# Patient Record
Sex: Male | Born: 1960
Health system: Southern US, Community
[De-identification: ages and names within clinical notes are randomized; demographics above are authoritative.]

## PROBLEM LIST (undated history)

## (undated) DIAGNOSIS — M1990 Unspecified osteoarthritis, unspecified site: Secondary | ICD-10-CM

## (undated) HISTORY — PX: VASECTOMY: SHX75

## (undated) HISTORY — DX: Unspecified osteoarthritis, unspecified site: M19.90

## (undated) HISTORY — PX: REPLACEMENT TOTAL KNEE BILATERAL: SUR1225

## (undated) HISTORY — PX: FRACTURE SURGERY: SHX138

---

## 2003-04-27 LAB — HM HIV SCREENING LAB: HM HIV Screening: NEGATIVE

## 2004-02-14 ENCOUNTER — Ambulatory Visit: Payer: Self-pay | Admitting: Surgery

## 2005-05-04 ENCOUNTER — Other Ambulatory Visit: Payer: Self-pay

## 2005-05-04 ENCOUNTER — Inpatient Hospital Stay: Payer: Self-pay | Admitting: General Practice

## 2006-05-01 ENCOUNTER — Ambulatory Visit: Payer: Self-pay | Admitting: Gastroenterology

## 2007-11-05 ENCOUNTER — Ambulatory Visit: Payer: Self-pay | Admitting: Unknown Physician Specialty

## 2007-11-11 ENCOUNTER — Ambulatory Visit: Payer: Self-pay | Admitting: Unknown Physician Specialty

## 2008-10-22 ENCOUNTER — Emergency Department: Payer: Self-pay | Admitting: Emergency Medicine

## 2008-12-13 ENCOUNTER — Ambulatory Visit: Payer: Self-pay | Admitting: Urology

## 2011-08-29 ENCOUNTER — Ambulatory Visit: Payer: Self-pay | Admitting: General Practice

## 2011-08-29 LAB — MRSA PCR SCREENING

## 2011-08-29 LAB — BASIC METABOLIC PANEL
BUN: 18 mg/dL (ref 7–18)
Calcium, Total: 9.1 mg/dL (ref 8.5–10.1)
Chloride: 104 mmol/L (ref 98–107)
Co2: 32 mmol/L (ref 21–32)
EGFR (African American): >60
Glucose: 103 mg/dL — ABNORMAL HIGH (ref 65–99)
Osmolality: 283 (ref 275–301)
Sodium: 141 mmol/L (ref 136–145)

## 2011-08-29 LAB — URINALYSIS, COMPLETE
Leukocyte Esterase: NEGATIVE
Nitrite: NEGATIVE
Ph: 5 (ref 4.5–8.0)
Protein: NEGATIVE
Squamous Epithelial: <1
WBC UR: NONE SEEN /HPF (ref 0–5)

## 2011-08-29 LAB — CBC
MCHC: 34.3 g/dL (ref 32.0–36.0)
RBC: 5.09 10*6/uL (ref 4.40–5.90)
RDW: 13.3 % (ref 11.5–14.5)
WBC: 5 10*3/uL (ref 3.8–10.6)

## 2011-08-29 LAB — PROTIME-INR: INR: 0.9

## 2011-09-12 ENCOUNTER — Inpatient Hospital Stay: Payer: Self-pay | Admitting: General Practice

## 2011-09-13 LAB — BASIC METABOLIC PANEL
BUN: 10 mg/dL (ref 7–18)
Calcium, Total: 8.1 mg/dL — ABNORMAL LOW (ref 8.5–10.1)
Creatinine: 0.99 mg/dL (ref 0.60–1.30)
EGFR (Non-African Amer.): >60
Glucose: 111 mg/dL — ABNORMAL HIGH (ref 65–99)
Osmolality: 277 (ref 275–301)
Potassium: 4 mmol/L (ref 3.5–5.1)
Sodium: 139 mmol/L (ref 136–145)

## 2011-09-14 LAB — HEMOGLOBIN: HGB: 11.9 g/dL — ABNORMAL LOW (ref 13.0–18.0)

## 2011-09-14 LAB — BASIC METABOLIC PANEL
Anion Gap: 7 (ref 7–16)
BUN: 10 mg/dL (ref 7–18)
Chloride: 97 mmol/L — ABNORMAL LOW (ref 98–107)
Co2: 32 mmol/L (ref 21–32)
Creatinine: 1.02 mg/dL (ref 0.60–1.30)
EGFR (African American): >60
EGFR (Non-African Amer.): >60
Osmolality: 271 (ref 275–301)
Sodium: 136 mmol/L (ref 136–145)

## 2012-06-18 ENCOUNTER — Ambulatory Visit: Payer: Self-pay | Admitting: Family Medicine

## 2012-06-26 ENCOUNTER — Ambulatory Visit: Payer: Self-pay | Admitting: Urology

## 2014-08-15 NOTE — Discharge Summary (Signed)
PATIENT NAME:  Douglas Warner, Douglas Warner MR#:  [id number] DATE OF BIRTH:  03-26-1961  DATE OF ADMISSION:  09/12/2011 DATE OF DISCHARGE:  09/14/2011  ADMITTING DIAGNOSIS: Degenerative arthrosis of right knee.   DISCHARGE DIAGNOSIS: Degenerative arthrosis of right knee.   HISTORY OF PRESENT ILLNESS: Patient is a 54 year old who has been followed at [facility] for progression of right knee pain. 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 He has continued to have significant medial joint line pain especially with weight-bearing activities. He denied any gross swelling of the knee. He did not see any significant improvement in condition despite activity modification, nonsteroidal medications as well as intraarticular cortisone injections. The pain had progressed to the point that it was significantly interfering with his activities of daily living. X-rays taken in [facility] orthopedics showed significant narrowing of the medial cartilage space with associated varus alignment. Subchondral sclerosis was noted. After discussion of the risks and benefits of surgical intervention, the patient expressed his understanding of the risks and benefits of surgery and agreed for plans for surgical intervention.   PROCEDURE: Right total knee arthroplasty using computer-assisted navigation.   ANESTHESIA: Femoral nerve block with spinal.   SOFT TISSUE RELEASE: Anterior cruciate ligament, posterior cruciate ligament, deep medial collateral ligaments as well as the patellofemoral ligament.   IMPLANTS UTILIZED: DePuy PFC Sigma size 5 posterior stabilized femoral component (cemented), size 5 MBT tibial component (cemented), 38 mm three pegged oval dome patella (cemented), and a 10 mm stabilized rotating platform polyethylene insert.   HOSPITAL COURSE: Patient tolerated the procedure  very well. He had no complications. He was then taken to PAC-U where he was stabilized then transferred to the orthopedic floor. He began receiving anticoagulation therapy of Lovenox 30 mg sub-Q q.12 hours per anesthesia and pharmacy protocol. He was fitted with TED stockings bilaterally. These were allowed to be removed one hour per eight hour shift. The right one was applied on day two following removal of the Hemovac and dressing change. Patient was also fitted with the AVI compression foot pumps set at 80 mmHg. Her calves have been nontender and free of any evidence of any deep venous thromboses. Negative Homans sign. Patient's heels were elevated off the bed using rolled towels. He has voiced no complaints.   Patient's vital signs have been stable. He has been afebrile. Hemodynamically he was stable. No transfusions were given other than the Autovac transfusions first six hours postoperatively.   Physical therapy was initiated on day one for gait training and transfers. Upon being discharged was ambulating greater than 200 feet. Was able go up and down four sets of steps. Was independent with bed to chair transfers. Occupational therapy was also initiated on day one for activities of daily living and assistive devices.   Patient's IV and Foley were discontinued on day one. Hemovac was discontinued on day two along with dressing change. The wound was free of any drainage or signs of infection. Polar Care was reapplied to the surgical leg maintaining a temperature of 40 to 50 degrees Fahrenheit.   DISPOSITION: Patient is being discharged to home in improved stable condition.   DISCHARGE INSTRUCTIONS:  1. May weight bear as tolerated. Continue using a walker until cleared by physical therapy to go to a quad cane.  2. Will receive home health physical therapy.  3. He is  to wear TED stockings during the day but may remove these at night.  4. He is to continue using Polar Care around-the-clock as much  as possible. He is to maintain a temperature of 40 to 50 degrees Fahrenheit.  5. He is placed on a regular diet.  6. He is to return to clinic in two weeks for removal of staples, sooner if any temperatures of 101.5 or greater or excessive bleeding.  7. He is to resume his regular medications that he was on prior to admission. He was given a prescription for Lovenox 40 mg sub-Q daily for 14 days, then discontinue and begin taking one 81 mg enteric coated aspirin. Prescription for Roxicodone 5 mg 1 to 2 tablets every 4 to 6 hours p.r.n. for pain and Ultram 50 mg 1 to 2 tablets every 4 to 6 hours p.r.n. for pain.       PAST MEDICAL HISTORY:  1. Hemorrhoids. 2. Chickenpox.   ____________________________ Van ClinesJon Wolfe, PA jrw:cms D: 09/14/2011 06:48:30 ET T: 09/17/2011 10:19:20 ET JOB#: [id number]  cc: Van ClinesJon Wolfe, PA, <Dictator> JON WOLFE PA ELECTRONICALLY SIGNED 09/25/2011 8:18

## 2014-08-15 NOTE — Op Note (Signed)
PATIENT NAME:  Douglas Warner, [name] MR#:  [id number] DATE OF BIRTH:  09-19-1960  DATE OF PROCEDURE:  09/12/2011  PREOPERATIVE DIAGNOSIS: Degenerative arthrosis of the right knee.   POSTOPERATIVE DIAGNOSIS: Degenerative arthrosis of the right knee.   PROCEDURE PERFORMED: Right total knee arthroplasty using computer-assisted navigation.   SURGEON: Illene LabradorJames P. Hooten, M.D.   ASSISTANT: Van ClinesJon Wolfe, PA-C (required to maintain retraction throughout the procedure)   ANESTHESIA: Femoral nerve block and spinal.   ESTIMATED BLOOD LOSS: 200 mL.   FLUIDS REPLACED: 1500 mL of crystalloid.   TOURNIQUET TIME: 109 minutes.   DRAINS: Two medium drains to reinfusion system.   SOFT TISSUE RELEASES: Anterior cruciate ligament, posterior cruciate ligament, deep medial collateral ligament, and patellofemoral ligament.   IMPLANTS UTILIZED: DePuy PFC Sigma size 5 posterior stabilized femoral component (cemented), size 5 MBT tibial component (cemented), 38-mm three peg oval dome patella (cemented), and a 10-mm stabilized rotating platform polyethylene insert.   INDICATIONS FOR SURGERY: The patient is a 54 year old male who has been seen for complaints of progressive right knee pain. X-rays demonstrated significant degenerative changes with varus deformity. After discussion of the risks and benefits of surgical intervention, the patient expressed his understanding of the risks and benefits and agreed with plans for surgical intervention.   PROCEDURE IN DETAIL: The patient was brought to the operating Room and, after adequate femoral nerve block and spinal anesthesia was achieved, a tourniquet was placed on the patient's upper right thigh. The patient's right knee and leg were cleaned and prepped with alcohol and DuraPrep and draped in the usual sterile fashion. A "time-out" was performed as per usual protocol. The right lower extremity was exsanguinated using an Esmarch and the tourniquet was inflated to 300 mmHg.  Anterior longitudinal incision was made followed by a standard mid vastus approach. A large effusion was evacuated. The deep fibers of the medial collateral ligament were elevated in a subperiosteal fashion off the medial flare of the tibia so as to maintain a continuous soft tissue sleeve. The patella was subluxed laterally and the patellofemoral ligament was incised. Inspection of the knee demonstrated severe degenerative changes in a tricompartmental fashion with most significant changes noted to the medial compartment. There was evidence of eburnated bone noted to the medial compartment. Prominent osteophytes were debrided using a rongeur. Anterior and posterior cruciate ligaments were excised. Two 4-mm Schanz pins were inserted into the femur and into the tibia for attachment of the tracker device as used for computer-assisted navigation. Hip center was identified using circumduction technique. Distal landmarks were mapped using the computer. Distal femur and proximal tibia were mapped using the computer. Distal femoral cutting guide was positioned using computer-assisted navigation so as to achieve 5-degree distal valgus cut. Cut was performed and verified using the computer.  The distal femur was then sized and size 5 femoral component was appropriate. A size 5 cutting guide was positioned and the anterior cut was performed. This was followed by completion of the posterior and chamfer cuts. The femoral cutting guide for the central box was then positioned and the central box cut was performed.   Attention was then directed to the proximal tibia. Medial and lateral menisci were excised. The extramedullary tibial cutting guide was positioned using computer-assisted navigation so as to achieve 0-degrees varus valgus alignment and 0-degree posterior slope. The cut was performed and verified using the computer. The proximal tibia was sized and it was felt that a size 5 tibial tray was appropriate. Tibial and  femoral  trials were inserted followed by insertion of a 10-mm polyethylene insert. Good medial and lateral soft tissue balancing was appreciated in both flexion and extension. Finally, the patella was cut and prepared so as to accommodate a 38-mm three peg oval dome patella. The patellar trial was placed and the knee was placed through a range of motion with excellent patellar tracking appreciated. The femoral trial was removed after debridement of posterior osteophytes. The post hole for the tibial component was reamed followed by insertion of a keel punch. The tibial trial was then removed. The cut surfaces of bone were irrigated with copious amounts of normal saline with antibiotic solution using pulsatile lavage and then suctioned dry. Polymethyl methacrylate cement with gentamicin was mixed in the usual fashion using a vacuum mixer. Cement was applied to the cut surface of the proximal tibia as well as along the undersurface of a size 5 MBT tibial component. The tibial component was positioned and impacted into place. Excess cement was removed using Personal assistant. Cement was then applied to the cut surface of the femur as well as along the posterior flanges of a size 5 posterior stabilized femoral component. The femoral component was positioned and impacted into place. Excess cement was removed using Personal assistant. A 10-mm polyethylene trial was inserted and the knee was brought into full extension with steady axial compression applied. Finally, cement was applied to the backside of a 38-mm three peg oval dome patella and the patellar component was positioned and the patellar clamp applied. Excess cement was removed using Personal assistant.   After adequate curing of the cement, the tourniquet was deflated after a total tourniquet time of 109 minutes. Hemostasis was achieved using electrocautery. The knee was irrigated with copious amounts of normal saline with antibiotic solution using pulsatile lavage and  then suctioned dry. The knee was inspected for any residual cement debris. 30 mL of 0.25% Marcaine with epinephrine was injected along the posterior capsule. A 10-mm stabilized rotating platform polyethylene insert was inserted and the knee was placed through a range of motion. Excellent patellar tracking was appreciated. Excellent medial and lateral soft tissue balancing was appreciated both in full extension and in flexion. Two medium drains were placed in the wound bed and brought out through a separate stab incision to be attached to a reinfusion system. The medial parapatellar portion of the incision was reapproximated using interrupted sutures of #1 Vicryl. The subcutaneous tissue was approximated in layers using first #0 Vicryl followed by #2-0 Vicryl. Skin was closed with skin staples. A sterile dressing was applied.   The patient tolerated procedure well. He was transported to the recovery room in stable condition.     ____________________________ Illene Labrador. Angie Fava., MD jph:bjt D: 09/13/2011 16:10:96 ET T: 09/13/2011 12:50:04 ET JOB#: [id number]  cc: Fayrene Fearing P. Angie Fava., MD, <Dictator> JAMES P Angie Fava MD ELECTRONICALLY SIGNED 09/14/2011 6:26

## 2015-05-30 ENCOUNTER — Emergency Department (HOSPITAL_COMMUNITY): Payer: 59

## 2015-05-30 ENCOUNTER — Emergency Department (HOSPITAL_COMMUNITY)
Admission: EM | Admit: 2015-05-30 | Discharge: 2015-05-30 | Disposition: A | Discharge status: Home / Self Care | Payer: 59 | Attending: Emergency Medicine | Admitting: Emergency Medicine

## 2015-05-30 ENCOUNTER — Encounter (HOSPITAL_COMMUNITY): Payer: Self-pay | Admitting: *Deleted

## 2015-05-30 DIAGNOSIS — Z87891 Personal history of nicotine dependence: Secondary | ICD-10-CM | POA: Diagnosis not present

## 2015-05-30 DIAGNOSIS — S82831A Other fracture of upper and lower end of right fibula, initial encounter for closed fracture: Secondary | ICD-10-CM | POA: Diagnosis not present

## 2015-05-30 DIAGNOSIS — Y9389 Activity, other specified: Secondary | ICD-10-CM | POA: Diagnosis not present

## 2015-05-30 DIAGNOSIS — Y92410 Unspecified street and highway as the place of occurrence of the external cause: Secondary | ICD-10-CM | POA: Diagnosis not present

## 2015-05-30 DIAGNOSIS — S9304XA Dislocation of right ankle joint, initial encounter: Secondary | ICD-10-CM | POA: Diagnosis not present

## 2015-05-30 DIAGNOSIS — Y998 Other external cause status: Secondary | ICD-10-CM | POA: Insufficient documentation

## 2015-05-30 DIAGNOSIS — Z880 Allergy status to penicillin: Secondary | ICD-10-CM | POA: Diagnosis not present

## 2015-05-30 DIAGNOSIS — S99921A Unspecified injury of right foot, initial encounter: Secondary | ICD-10-CM

## 2015-05-30 DIAGNOSIS — S82451A Displaced comminuted fracture of shaft of right fibula, initial encounter for closed fracture: Secondary | ICD-10-CM | POA: Insufficient documentation

## 2015-05-30 DIAGNOSIS — S92334A Nondisplaced fracture of third metatarsal bone, right foot, initial encounter for closed fracture: Secondary | ICD-10-CM | POA: Diagnosis not present

## 2015-05-30 DIAGNOSIS — S299XXA Unspecified injury of thorax, initial encounter: Secondary | ICD-10-CM | POA: Diagnosis not present

## 2015-05-30 DIAGNOSIS — S93304A Unspecified dislocation of right foot, initial encounter: Secondary | ICD-10-CM | POA: Diagnosis not present

## 2015-05-30 DIAGNOSIS — Z23 Encounter for immunization: Secondary | ICD-10-CM | POA: Diagnosis not present

## 2015-05-30 DIAGNOSIS — S82891A Other fracture of right lower leg, initial encounter for closed fracture: Secondary | ICD-10-CM | POA: Diagnosis not present

## 2015-05-30 DIAGNOSIS — S8992XA Unspecified injury of left lower leg, initial encounter: Secondary | ICD-10-CM | POA: Diagnosis not present

## 2015-05-30 DIAGNOSIS — Z96651 Presence of right artificial knee joint: Secondary | ICD-10-CM | POA: Diagnosis not present

## 2015-05-30 DIAGNOSIS — S3993XA Unspecified injury of pelvis, initial encounter: Secondary | ICD-10-CM | POA: Diagnosis not present

## 2015-05-30 DIAGNOSIS — M79651 Pain in right thigh: Secondary | ICD-10-CM | POA: Diagnosis not present

## 2015-05-30 DIAGNOSIS — Z471 Aftercare following joint replacement surgery: Secondary | ICD-10-CM | POA: Diagnosis not present

## 2015-05-30 DIAGNOSIS — M24874 Other specific joint derangements of right foot, not elsewhere classified: Secondary | ICD-10-CM | POA: Diagnosis not present

## 2015-05-30 LAB — CBC WITH DIFFERENTIAL/PLATELET
Basophils Absolute: 0 10*3/uL (ref 0.0–0.1)
Basophils Relative: 0 %
Eosinophils Absolute: 0.1 10*3/uL (ref 0.0–0.7)
Eosinophils Relative: 1 %
HCT: 45 % (ref 39.0–52.0)
HEMOGLOBIN: 15.6 g/dL (ref 13.0–17.0)
LYMPHS ABS: 1.9 10*3/uL (ref 0.7–4.0)
LYMPHS PCT: 25 %
MCH: 30.8 pg (ref 26.0–34.0)
MCHC: 34.7 g/dL (ref 30.0–36.0)
MCV: 88.8 fL (ref 78.0–100.0)
Monocytes Absolute: 0.5 10*3/uL (ref 0.1–1.0)
Monocytes Relative: 6 %
NEUTROS ABS: 5 10*3/uL (ref 1.7–7.7)
NEUTROS PCT: 68 %
Platelets: 206 10*3/uL (ref 150–400)
RBC: 5.07 MIL/uL (ref 4.22–5.81)
RDW: 12.7 % (ref 11.5–15.5)
WBC: 7.4 10*3/uL (ref 4.0–10.5)

## 2015-05-30 LAB — COMPREHENSIVE METABOLIC PANEL
ALK PHOS: 79 U/L (ref 38–126)
ALT: 26 U/L (ref 17–63)
AST: 34 U/L (ref 15–41)
Albumin: 5 g/dL (ref 3.5–5.0)
Anion gap: 14 (ref 5–15)
BUN: 17 mg/dL (ref 6–20)
CALCIUM: 8.9 mg/dL (ref 8.9–10.3)
CO2: 27 mmol/L (ref 22–32)
CREATININE: 1.88 mg/dL — AB (ref 0.61–1.24)
Chloride: 96 mmol/L — ABNORMAL LOW (ref 101–111)
GFR, EST AFRICAN AMERICAN: 45 mL/min — AB (ref >60)
GFR, EST NON AFRICAN AMERICAN: 39 mL/min — AB (ref >60)
Glucose, Bld: 105 mg/dL — ABNORMAL HIGH (ref 65–99)
Potassium: 3.8 mmol/L (ref 3.5–5.1)
SODIUM: 137 mmol/L (ref 135–145)
Total Bilirubin: 1.5 mg/dL — ABNORMAL HIGH (ref 0.3–1.2)
Total Protein: 6.8 g/dL (ref 6.5–8.1)

## 2015-05-30 LAB — PROTIME-INR
INR: 1 (ref 0.00–1.49)
Prothrombin Time: 13.4 seconds (ref 11.6–15.2)

## 2015-05-30 LAB — TYPE AND SCREEN
ABO/RH(D): A POS
ANTIBODY SCREEN: NEGATIVE

## 2015-05-30 LAB — CDS SEROLOGY

## 2015-05-30 MED ORDER — DIAZEPAM 5 MG/ML IJ SOLN
5.0000 mg | Freq: Once | INTRAMUSCULAR | Status: AC
  Administered 2015-05-30: 5 mg via INTRAVENOUS
  Filled 2015-05-30: qty 2

## 2015-05-30 MED ORDER — FENTANYL CITRATE (PF) 100 MCG/2ML IJ SOLN
100.0000 ug | Freq: Once | INTRAMUSCULAR | Status: AC
  Administered 2015-05-30: 50 ug via INTRAVENOUS
  Filled 2015-05-30: qty 2

## 2015-05-30 MED ORDER — HYDROMORPHONE HCL 1 MG/ML IJ SOLN
1.0000 mg | Freq: Once | INTRAMUSCULAR | Status: DC
  Filled 2015-05-30: qty 1

## 2015-05-30 MED ORDER — SODIUM CHLORIDE 0.9 % IV BOLUS (SEPSIS)
1000.0000 mL | Freq: Once | INTRAVENOUS | Status: AC
  Administered 2015-05-30: 1000 mL via INTRAVENOUS

## 2015-05-30 MED ORDER — CLINDAMYCIN PHOSPHATE 600 MG/50ML IV SOLN
600.0000 mg | Freq: Once | INTRAVENOUS | Status: AC
  Administered 2015-05-30: 600 mg via INTRAVENOUS
  Filled 2015-05-30: qty 50

## 2015-05-30 MED ORDER — OXYCODONE-ACETAMINOPHEN 5-325 MG PO TABS: 1.0000 | ORAL_TABLET | Freq: Three times a day (TID) | ORAL | Status: DC | PRN

## 2015-05-30 MED ORDER — LIDOCAINE HCL 2 % IJ SOLN
10.0000 mL | Freq: Once | INTRAMUSCULAR | Status: AC
  Administered 2015-05-30: 200 mg via INTRADERMAL
  Filled 2015-05-30: qty 20

## 2015-05-30 MED ORDER — HYDROMORPHONE HCL 1 MG/ML IJ SOLN
1.0000 mg | Freq: Once | INTRAMUSCULAR | Status: AC
  Administered 2015-05-30: 1 mg via INTRAVENOUS
  Filled 2015-05-30: qty 1

## 2015-05-30 MED ORDER — TETANUS-DIPHTH-ACELL PERTUSSIS 5-2.5-18.5 LF-MCG/0.5 IM SUSP
0.5000 mL | Freq: Once | INTRAMUSCULAR | Status: AC
Start: 2015-05-30 — End: 2015-05-30
  Administered 2015-05-30: 0.5 mL via INTRAMUSCULAR
  Filled 2015-05-30: qty 0.5

## 2015-05-30 NOTE — Progress Notes (Signed)
Orthopedic Tech Progress Note Patient Details:  Douglas Warner [birth year] [id number]  Ortho Devices Type of Ortho Device: Warner (short leg) splint, Stirrup splint Ortho Device/Splint Location: rle Ortho Device/Splint Interventions: Ordered, Application Assisted dr Ranell Patrick with ankle dislocation reduction  Douglas Warner 05/30/2015, 9:36 PM

## 2015-05-30 NOTE — Discharge Instructions (Signed)

## 2015-05-30 NOTE — ED Notes (Signed)
C collar removed by Silverio Lay, MD
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
## 2015-05-30 NOTE — Progress Notes (Signed)
Orthopedic Tech Progress Note Patient Details:  Douglas Warner Jan 03, 1961 [id number]  Ortho Devices Type of Ortho Device: Post (short leg) splint, Stirrup splint Ortho Device/Splint Location: rle Ortho Device/Splint Interventions: Ordered, Application Assisted dr Ranell Patrick to replace fiberglass splint with plaster splint  [name] 05/30/2015, 10:27 PM
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
## 2015-05-31 LAB — ABO/RH: ABO/RH(D): A POS

## 2015-06-06 DIAGNOSIS — S92341A Displaced fracture of fourth metatarsal bone, right foot, initial encounter for closed fracture: Secondary | ICD-10-CM | POA: Diagnosis not present

## 2015-06-06 DIAGNOSIS — S92331A Displaced fracture of third metatarsal bone, right foot, initial encounter for closed fracture: Secondary | ICD-10-CM | POA: Diagnosis not present

## 2015-06-06 DIAGNOSIS — S9304XA Dislocation of right ankle joint, initial encounter: Secondary | ICD-10-CM | POA: Diagnosis not present

## 2015-06-06 DIAGNOSIS — S92211A Displaced fracture of cuboid bone of right foot, initial encounter for closed fracture: Secondary | ICD-10-CM | POA: Diagnosis not present

## 2015-07-06 DIAGNOSIS — S92211D Displaced fracture of cuboid bone of right foot, subsequent encounter for fracture with routine healing: Secondary | ICD-10-CM | POA: Diagnosis not present

## 2015-07-06 DIAGNOSIS — S92341A Displaced fracture of fourth metatarsal bone, right foot, initial encounter for closed fracture: Secondary | ICD-10-CM | POA: Diagnosis not present

## 2015-07-06 DIAGNOSIS — S92331A Displaced fracture of third metatarsal bone, right foot, initial encounter for closed fracture: Secondary | ICD-10-CM | POA: Diagnosis not present

## 2015-08-10 DIAGNOSIS — S92311D Displaced fracture of first metatarsal bone, right foot, subsequent encounter for fracture with routine healing: Secondary | ICD-10-CM | POA: Diagnosis not present

## 2015-08-10 DIAGNOSIS — S92211D Displaced fracture of cuboid bone of right foot, subsequent encounter for fracture with routine healing: Secondary | ICD-10-CM | POA: Diagnosis not present
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
## 2017-05-01 ENCOUNTER — Ambulatory Visit (INDEPENDENT_AMBULATORY_CARE_PROVIDER_SITE_OTHER): Payer: 59 | Admitting: Family Medicine

## 2017-05-01 ENCOUNTER — Other Ambulatory Visit: Payer: Self-pay

## 2017-05-01 ENCOUNTER — Encounter: Payer: Self-pay | Admitting: Family Medicine

## 2017-05-01 VITALS — BP 132/72 | HR 85 | Temp 98.2°F | Resp 16 | Ht 72.0 in | Wt 205.0 lb

## 2017-05-01 DIAGNOSIS — Z0000 Encounter for general adult medical examination without abnormal findings: Secondary | ICD-10-CM | POA: Diagnosis not present

## 2017-05-01 DIAGNOSIS — Z800 Family history of malignant neoplasm of digestive organs: Secondary | ICD-10-CM | POA: Diagnosis not present

## 2017-05-01 DIAGNOSIS — Z1159 Encounter for screening for other viral diseases: Secondary | ICD-10-CM

## 2017-05-01 DIAGNOSIS — F17220 Nicotine dependence, chewing tobacco, uncomplicated: Secondary | ICD-10-CM | POA: Insufficient documentation

## 2017-05-01 NOTE — Assessment & Plan Note (Signed)
Discussed risks of tobacco use Patient is not interested in quitting Continue to address at future visits
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
## 2017-05-01 NOTE — Patient Instructions (Signed)

## 2017-05-01 NOTE — Assessment & Plan Note (Signed)
Will request colonoscopy records from GI Due to mother's colon cancer, patient may be candidate for q5 yr screening He declines referral at this time

## 2017-05-02 LAB — COMPREHENSIVE METABOLIC PANEL
ALT: 20 IU/L (ref 0–44)
AST: 19 IU/L (ref 0–40)
Albumin/Globulin Ratio: 2 (ref 1.2–2.2)
Albumin: 4.7 g/dL (ref 3.5–5.5)
Alkaline Phosphatase: 66 IU/L (ref 39–117)
BILIRUBIN TOTAL: 1.6 mg/dL — AB (ref 0.0–1.2)
BUN/Creatinine Ratio: 9 (ref 9–20)
BUN: 9 mg/dL (ref 6–24)
CALCIUM: 9.6 mg/dL (ref 8.7–10.2)
CHLORIDE: 104 mmol/L (ref 96–106)
CO2: 25 mmol/L (ref 20–29)
Creatinine, Ser: 0.97 mg/dL (ref 0.76–1.27)
GFR calc non Af Amer: 86 mL/min/{1.73_m2} (ref >59)
GFR, EST AFRICAN AMERICAN: 100 mL/min/{1.73_m2} (ref >59)
Globulin, Total: 2.3 g/dL (ref 1.5–4.5)
Glucose: 98 mg/dL (ref 65–99)
Potassium: 4.3 mmol/L (ref 3.5–5.2)
Sodium: 141 mmol/L (ref 134–144)
TOTAL PROTEIN: 7 g/dL (ref 6.0–8.5)

## 2017-05-02 LAB — LIPID PANEL
Chol/HDL Ratio: 3.7 ratio (ref 0.0–5.0)
Cholesterol, Total: 181 mg/dL (ref 100–199)
HDL: 49 mg/dL (ref >39)
LDL Calculated: 114 mg/dL — ABNORMAL HIGH (ref 0–99)
TRIGLYCERIDES: 89 mg/dL (ref 0–149)
VLDL CHOLESTEROL CAL: 18 mg/dL (ref 5–40)

## 2017-05-02 LAB — CBC
Hematocrit: 43.6 % (ref 37.5–51.0)
Hemoglobin: 15 g/dL (ref 13.0–17.7)
MCH: 30.4 pg (ref 26.6–33.0)
MCHC: 34.4 g/dL (ref 31.5–35.7)
MCV: 88 fL (ref 79–97)
PLATELETS: 198 10*3/uL (ref 150–379)
RBC: 4.93 x10E6/uL (ref 4.14–5.80)
RDW: 13.4 % (ref 12.3–15.4)
WBC: 6 10*3/uL (ref 3.4–10.8)

## 2017-05-02 LAB — HEPATITIS C ANTIBODY

## 2017-05-03 ENCOUNTER — Telehealth: Payer: Self-pay

## 2017-05-03 NOTE — Telephone Encounter (Signed)
Patient advised as below.  

## 2017-05-03 NOTE — Telephone Encounter (Signed)
-----   Message from Erasmo DownerAngela M Bacigalupo, MD sent at 05/03/2017  4:20 PM EST ----- Normal Blood counts, kidney function, liver function, electrolytes. Cholesterol is ok.  Negative Hep C screen  Erasmo DownerBacigalupo, Angela M, MD, MPH [facility] 05/03/2017 4:20 PM

## 2018-05-02 ENCOUNTER — Encounter: Payer: 59 | Admitting: Family Medicine

## 2018-05-02 NOTE — Progress Notes (Deleted)
Patient: Douglas Warner, Male    DOB: 1961/03/09, 58 y.o.   MRN: [id number] Visit Date: 05/02/2018  Today's Provider: Shirlee Latch, MD   No chief complaint on file.  Subjective:    Annual physical exam Douglas Warner is a 58 y.o. male who presents today for health maintenance and complete physical. He feels {DESC; WELL/FAIRLY WELL/POORLY:18703}. He reports exercising ***. He reports he is sleeping {DESC; WELL/FAIRLY WELL/POORLY:18703}.  -----------------------------------------------------------------   Review of Systems  Constitutional: Negative.   HENT: Negative.   Eyes: Negative.   Respiratory: Negative.   Cardiovascular: Negative.   Gastrointestinal: Negative.   Endocrine: Negative.   Genitourinary: Negative.   Musculoskeletal: Negative.   Skin: Negative.   Allergic/Immunologic: Negative.   Neurological: Negative.   Hematological: Negative.   Psychiatric/Behavioral: Negative.     Social History      He  reports that he has been smoking cigars. His smokeless tobacco use includes chew. He reports current alcohol use of about 3.0 - 6.0 standard drinks of alcohol per week. He reports that he does not use drugs.       Social History   Socioeconomic History  . Marital status: Married    Spouse name: Burna Mortimer)  . Number of children: 2  . Years of education: 37  . Highest education level: High school graduate  Occupational History  . 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 Financial resource strain: Not hard at all  . Food insecurity:    Worry: Never true    Inability: Never true  . Transportation needs:    Medical: No    Non-medical: No  Tobacco Use  . Smoking status: Current Some Day Smoker    Types: Cigars  . Smokeless tobacco: Current User    Types: Chew  . Tobacco comment: 1 cigar per year  Substance and Sexual Activity  . Alcohol use: Yes    Alcohol/week: 3.0 - 6.0 standard drinks    Types: 3 - 6  Cans of beer per week  . Drug use: No  . Sexual activity: Yes    Birth control/protection: Surgical  Lifestyle  . Physical activity:    Days per week: 0 days    Minutes per session: 0 min  . Stress: Not on file  Relationships  . Social connections:    Talks on phone: Not on file    Gets together: Not on file    Attends religious service: Not on file    Active member of club or organization: Not on file    Attends meetings of clubs or organizations: Not on file    Relationship status: Not on file  Other Topics Concern  . Not on file  Social History Narrative  . Not on file    Past Medical History:  Diagnosis Date  . Arthritis      Patient Active Problem List   Diagnosis Date Noted  . Family history of colon cancer 05/01/2017  . Tobacco dependence due to chewing tobacco 05/01/2017    Past Surgical History:  Procedure Laterality Date  . FRACTURE SURGERY     multiple, including thumb, nose x 6 times, left occipital,  . REPLACEMENT TOTAL KNEE BILATERAL    . VASECTOMY      Family History        Family Status  Relation Name Status  . Mother  Deceased  . Father  Deceased  . Brother  Deceased at age 583  . MGM  (Not Specified)  . MGF  (Not Specified)  . PGM  (Not Specified)        His family history includes Cancer in his maternal grandmother; Colon cancer (age of onset: 5530) in his mother; Diabetes in his maternal grandmother; Early death in his brother; Healthy in his father; Osteoporosis in his mother; Premature birth in his brother; Skin cancer in his paternal grandmother; Testicular cancer (age of onset: 5380) in his maternal grandfather.      Allergies  Allergen Reactions  . Penicillins Anaphylaxis    No current outpatient medications on file.   Patient Care Team: Erasmo DownerBacigalupo, Angela M, MD as PCP - General (Family Medicine)      Objective:   Vitals: There were no vitals taken for this visit.  There were no vitals filed for this visit.   Physical  Exam   Depression Screen PHQ 2/9 Scores 05/01/2017  PHQ - 2 Score 0      Assessment & Plan:     Routine Health Maintenance and Physical Exam  Exercise Activities and Dietary recommendations Goals   None     Immunization History  Administered Date(s) Administered  . Tdap 05/30/2015    Health Maintenance  Topic Date Due  . COLONOSCOPY  09/29/2009  . INFLUENZA VACCINE  11/21/2017  . TETANUS/TDAP  05/29/2025  . Hepatitis C Screening  Completed  . HIV Screening  Completed     Discussed health benefits of physical activity, and encouraged him to engage in regular exercise appropriate for his age and condition.    --------------------------------------------------------------------    Shirlee LatchAngela Bacigalupo, MD  [facility]

## 2019-10-08 DIAGNOSIS — M1612 Unilateral primary osteoarthritis, left hip: Secondary | ICD-10-CM | POA: Diagnosis not present

## 2019-10-08 DIAGNOSIS — M25552 Pain in left hip: Secondary | ICD-10-CM | POA: Diagnosis not present

## 2019-10-15 DIAGNOSIS — H903 Sensorineural hearing loss, bilateral: Secondary | ICD-10-CM | POA: Diagnosis not present

## 2019-10-29 DIAGNOSIS — Z461 Encounter for fitting and adjustment of hearing aid: Secondary | ICD-10-CM | POA: Diagnosis not present

## 2019-10-30 DIAGNOSIS — I10 Essential (primary) hypertension: Secondary | ICD-10-CM | POA: Diagnosis not present

## 2019-10-30 DIAGNOSIS — M19031 Primary osteoarthritis, right wrist: Secondary | ICD-10-CM | POA: Diagnosis not present

## 2019-10-30 DIAGNOSIS — Z0189 Encounter for other specified special examinations: Secondary | ICD-10-CM | POA: Diagnosis not present

## 2019-10-30 DIAGNOSIS — M25552 Pain in left hip: Secondary | ICD-10-CM | POA: Diagnosis not present

## 2019-10-30 DIAGNOSIS — R413 Other amnesia: Secondary | ICD-10-CM | POA: Diagnosis not present

## 2019-10-31 DIAGNOSIS — Z0189 Encounter for other specified special examinations: Secondary | ICD-10-CM | POA: Diagnosis not present

## 2019-12-09 DIAGNOSIS — Z0189 Encounter for other specified special examinations: Secondary | ICD-10-CM | POA: Diagnosis not present

## 2019-12-24 DIAGNOSIS — M1612 Unilateral primary osteoarthritis, left hip: Secondary | ICD-10-CM | POA: Diagnosis not present

## 2019-12-24 DIAGNOSIS — Z5329 Procedure and treatment not carried out because of patient's decision for other reasons: Secondary | ICD-10-CM | POA: Diagnosis not present

## 2020-01-01 DIAGNOSIS — R413 Other amnesia: Secondary | ICD-10-CM | POA: Diagnosis not present

## 2020-01-01 DIAGNOSIS — M1612 Unilateral primary osteoarthritis, left hip: Secondary | ICD-10-CM | POA: Diagnosis not present

## 2020-01-01 DIAGNOSIS — M25552 Pain in left hip: Secondary | ICD-10-CM | POA: Diagnosis not present

## 2020-02-03 DIAGNOSIS — M1612 Unilateral primary osteoarthritis, left hip: Secondary | ICD-10-CM | POA: Diagnosis not present

## 2020-02-03 DIAGNOSIS — Z01818 Encounter for other preprocedural examination: Secondary | ICD-10-CM | POA: Diagnosis not present

## 2020-02-03 DIAGNOSIS — F17220 Nicotine dependence, chewing tobacco, uncomplicated: Secondary | ICD-10-CM | POA: Diagnosis not present

## 2020-02-03 DIAGNOSIS — F1010 Alcohol abuse, uncomplicated: Secondary | ICD-10-CM | POA: Diagnosis not present

## 2020-02-16 DIAGNOSIS — M25752 Osteophyte, left hip: Secondary | ICD-10-CM | POA: Diagnosis not present

## 2020-02-16 DIAGNOSIS — M25552 Pain in left hip: Secondary | ICD-10-CM | POA: Diagnosis not present

## 2020-02-16 DIAGNOSIS — M1612 Unilateral primary osteoarthritis, left hip: Secondary | ICD-10-CM | POA: Diagnosis not present

## 2020-02-16 DIAGNOSIS — R262 Difficulty in walking, not elsewhere classified: Secondary | ICD-10-CM | POA: Diagnosis not present

## 2020-02-16 DIAGNOSIS — Z96642 Presence of left artificial hip joint: Secondary | ICD-10-CM | POA: Diagnosis not present

## 2020-02-17 DIAGNOSIS — M25552 Pain in left hip: Secondary | ICD-10-CM | POA: Diagnosis not present

## 2020-02-17 DIAGNOSIS — R262 Difficulty in walking, not elsewhere classified: Secondary | ICD-10-CM | POA: Diagnosis not present

## 2020-02-19 ENCOUNTER — Other Ambulatory Visit: Payer: Self-pay | Admitting: *Deleted

## 2020-02-19 NOTE — Patient Outreach (Signed)
[facility]) Care Management  02/19/2020  Douglas Warner 1961-01-23 [id number]   Transition of care call/case closure   Referral received:02/16/20 Initial outreach:02/19/20 Insurance: [place] [employer]    Subjective: Initial successful telephone call to patient's preferred number in order to complete transition of care assessment; 2 HIPAA identifiers verified patient placed his wife Douglas Warner on the phone  Explained purpose of call, wife states that she recently retired form [employer], she questions why patient receiving call. Explained [facility] care management/[place] employee plan notification of patient admission for surgery. [facility] states [facility] but [facility],  had surgery at [facility] , she declined further assessment at this time. She states recently receiving a call from [facility] patient is doing okay at home and the [place]  did a beautiful job in coordinating patient after discharge care, he has home health setup and all the equipment that he needs.      Objective:  DouglasWarner was hospitalized at [facility] 10/26-10/28/21 for left hip surgery. He was discharged home with home health services and reports having needed equipment. .   Patient and wife declined [facility] transition of care assessment , and reports no identified care needs will close to [facility] care management, and route successful outreach letter with [facility] Care Management pamphlet and 24 Hour Nurse Line Magnet to [employer] Care Management clinical pool to be mailed to patient's home address.    Douglas Garibaldi, RN, BSN  [facility] Care Management,Care Management Coordinator  [phone]- Mobile [phone]- Toll Free Main Office

## 2020-03-04 DIAGNOSIS — Z96642 Presence of left artificial hip joint: Secondary | ICD-10-CM | POA: Diagnosis not present

## 2020-03-04 DIAGNOSIS — R945 Abnormal results of liver function studies: Secondary | ICD-10-CM | POA: Diagnosis not present

## 2020-03-04 DIAGNOSIS — Z471 Aftercare following joint replacement surgery: Secondary | ICD-10-CM | POA: Diagnosis not present

## 2020-04-01 DIAGNOSIS — Z471 Aftercare following joint replacement surgery: Secondary | ICD-10-CM | POA: Diagnosis not present

## 2020-04-01 DIAGNOSIS — Z96642 Presence of left artificial hip joint: Secondary | ICD-10-CM | POA: Diagnosis not present
